# Patient Record
Sex: Male | Born: 1950 | Race: White | Hispanic: No | Marital: Married | Smoking: Never smoker
Health system: Southern US, Community
[De-identification: ages and names within clinical notes are randomized; demographics above are authoritative.]

## PROBLEM LIST (undated history)

## (undated) DIAGNOSIS — F32A Depression, unspecified: Secondary | ICD-10-CM

## (undated) DIAGNOSIS — E785 Hyperlipidemia, unspecified: Secondary | ICD-10-CM

## (undated) DIAGNOSIS — F329 Major depressive disorder, single episode, unspecified: Secondary | ICD-10-CM

## (undated) DIAGNOSIS — N529 Male erectile dysfunction, unspecified: Secondary | ICD-10-CM

## (undated) HISTORY — DX: Male erectile dysfunction, unspecified: N52.9

## (undated) HISTORY — DX: Major depressive disorder, single episode, unspecified: F32.9

## (undated) HISTORY — DX: Hyperlipidemia, unspecified: E78.5

## (undated) HISTORY — DX: Depression, unspecified: F32.A

---

## 2010-10-10 ENCOUNTER — Encounter
Admission: RE | Admit: 2010-10-10 | Discharge: 2010-11-29 | Payer: Self-pay | Source: Home / Self Care | Attending: Orthopaedic Surgery | Admitting: Orthopaedic Surgery

## 2014-06-23 ENCOUNTER — Encounter: Payer: Self-pay | Admitting: Cardiology

## 2014-07-17 ENCOUNTER — Encounter: Payer: Self-pay | Admitting: *Deleted

## 2014-08-12 ENCOUNTER — Ambulatory Visit: Payer: Self-pay | Admitting: Cardiology

## 2014-08-20 ENCOUNTER — Encounter: Payer: Self-pay | Admitting: Cardiology

## 2014-08-20 ENCOUNTER — Ambulatory Visit (INDEPENDENT_AMBULATORY_CARE_PROVIDER_SITE_OTHER): Payer: BC Managed Care – PPO | Admitting: Cardiology

## 2014-08-20 VITALS — BP 130/80 | HR 92 | Ht 70.5 in | Wt 236.0 lb

## 2014-08-20 DIAGNOSIS — N528 Other male erectile dysfunction: Secondary | ICD-10-CM | POA: Insufficient documentation

## 2014-08-20 DIAGNOSIS — I10 Essential (primary) hypertension: Secondary | ICD-10-CM

## 2014-08-20 DIAGNOSIS — I471 Supraventricular tachycardia, unspecified: Secondary | ICD-10-CM

## 2014-08-20 DIAGNOSIS — E669 Obesity, unspecified: Secondary | ICD-10-CM

## 2014-08-20 DIAGNOSIS — N529 Male erectile dysfunction, unspecified: Secondary | ICD-10-CM

## 2014-08-20 NOTE — Patient Instructions (Signed)
Your physician recommends that you continue on your current medications as directed. Please refer to the Current Medication list given to you today.  Your physician wants you to follow-up in: ONE YEAR FOLLOW-UP WITH DR SKAINS You will receive a reminder letter in the mail two months in advance. If you don't receive a letter, please call our office to schedule the follow-up appointment.  

## 2014-08-20 NOTE — Progress Notes (Signed)
[address] [place], [place]  [address] Phone: [phone] Fax:  [phone]  Date:  08/20/2014   ID:  Steven Leblanc, DOB 1950-12-11, MRN [id number]  PCP:  Darnelle Bos, MD   History of Present Illness: Steven Leblanc is a 63 y.o. male followup today for tachycardia. Echocardiogram revealed normal ejection fraction. Holter monitor revealed an average heart rate of 108 which appeared to be sinus tachycardia. Diltiazem long acting at 360 mg was initiated. His pulse decreased appropriately. He also has diabetes as well as hyperlipidemia and is currently compliant with his medicines. I have also initiated metoprolol 50 mg once a day.  He is doing well. 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 No side effects of medications.  No change in symptoms. There was some concern by pharmacist about filling Levitra. I discussed with our pharmacist, Steven Leblanc, there should be no interaction with his medications.    Wt Readings from Last 3 Encounters:  08/20/14 236 lb (107.049 kg)     Past Medical History  Diagnosis Date  . ED (erectile dysfunction)   . Hyperlipidemia   . Depression     No past surgical history on file.  Current Outpatient Prescriptions  Medication Sig Dispense Refill  . aspirin 325 MG tablet Take 325 mg by mouth daily.      Marland Kitchen atorvastatin (LIPITOR) 10 MG tablet Take 10 mg by mouth daily.      Marland Kitchen diltiazem (TIAZAC) 360 MG 24 hr capsule Take 360 mg by mouth daily.      Marland Kitchen glimepiride (AMARYL) 2 MG tablet Take 2 mg by mouth daily with breakfast.      . metoprolol succinate (TOPROL-XL) 50 MG 24 hr tablet Take 50 mg by mouth daily. Take with or immediately following a meal.      . NAFTIN 1 % GEL       . vardenafil (LEVITRA) 20 MG tablet Take 20 mg by mouth daily as needed for erectile dysfunction.      Marland Kitchen venlafaxine (EFFEXOR) 75 MG tablet Take 75 mg by mouth 2 (two) times daily.       No current facility-administered medications for this  visit.    Allergies:   No Known Allergies  Social History:  The patient  reports that he has never smoked. He does not have any smokeless tobacco history on file. He reports that he does not drink alcohol or use illicit drugs.   No family history on file.  ROS:  Please see the history of present illness.   Denies any syncope, bleeding, orthopnea, PND   All other systems reviewed and negative.   PHYSICAL EXAM: VS:  BP 130/80  Pulse 92  Ht 5' 10.5" (1.791 m)  Wt 236 lb (107.049 kg)  BMI 33.37 kg/m2 Well nourished, well developed, in no acute distress HEENT: normal, [place]/AT, EOMI Neck: no JVD, normal carotid upstroke, no bruit Cardiac:  normal S1, S2; RRR; no murmur Lungs:  clear to auscultation bilaterally, no wheezing, rhonchi or rales Abd: soft, nontender, no hepatomegaly, no bruitsObese Ext: no edema, 2+ distal pulses Skin: warm and dry GU: deferred Neuro: no focal abnormalities noted, AAO x 3  EKG:  08/20/14-sinus rhythm, 92 with no other abnormalities     ASSESSMENT AND PLAN:  1. Tachycardia-doing well, continue with her medications. Her symptoms. 2. Erectile dysfunction-he is fine to take his Levitra. There should be no interactions. I have given a prescription stating  that his pharmacist may prescribe. Dr. Earl Gala and given and this in the past. 3. Hypertension-currently well controlled. 4. Obesity-continue to limit carbohydrates. Try to lose weight. Exercise.  Signed, Donato Schultz, MD [facility]  08/20/2014 9:54 AM

## 2015-08-31 ENCOUNTER — Encounter: Payer: Self-pay | Admitting: Cardiology

## 2015-08-31 ENCOUNTER — Ambulatory Visit (INDEPENDENT_AMBULATORY_CARE_PROVIDER_SITE_OTHER): Payer: BLUE CROSS/BLUE SHIELD | Admitting: Cardiology

## 2015-08-31 VITALS — BP 142/88 | HR 96 | Ht 70.5 in | Wt 234.8 lb

## 2015-08-31 DIAGNOSIS — E669 Obesity, unspecified: Secondary | ICD-10-CM

## 2015-08-31 DIAGNOSIS — I471 Supraventricular tachycardia: Secondary | ICD-10-CM

## 2015-08-31 DIAGNOSIS — I10 Essential (primary) hypertension: Secondary | ICD-10-CM | POA: Diagnosis not present

## 2015-08-31 NOTE — Patient Instructions (Signed)
Medication Instructions:  Your physician recommends that you continue on your current medications as directed. Please refer to the Current Medication list given to you today.  Labwork: NONE  Testing/Procedures: NONE  Follow-Up: Your physician wants you to follow-up in: 1 year with Dr. Skains. You will receive a reminder letter in the mail two months in advance. If you don't receive a letter, please call our office to schedule the follow-up appointment.   Any Other Special Instructions Will Be Listed Below (If Applicable).   

## 2015-08-31 NOTE — Progress Notes (Signed)
[address] [place], [place]  [address] Phone: [phone] Fax:  [phone]  Date:  08/31/2015   ID:  KILEY TORRENCE, DOB 1951/05/29, MRN [id number]  PCP:  Lillia Mountain, MD   History of Present Illness: Steven Leblanc is a 64 y.o. male followup today for tachycardia. Echocardiogram revealed normal ejection fraction. Holter monitor revealed an average heart rate of 108 which appeared to be sinus tachycardia. Diltiazem long acting at 360 mg was initiated. His pulse decreased appropriately. He also has diabetes as well as hyperlipidemia and is currently compliant with his medicines. I have also initiated metoprolol 50 mg once a day.  He is doing well. 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 No side effects of medications.  No change in symptoms.  No chest pain, no syncope, no bleeding, no orthopnea, no PND. Waiting to see Dr. Valentina Lucks. New PCP.   Wt Readings from Last 3 Encounters:  08/31/15 234 lb 12.8 oz (106.505 kg)  08/20/14 236 lb (107.049 kg)     Past Medical History  Diagnosis Date  . ED (erectile dysfunction)   . Hyperlipidemia   . Depression     No past surgical history on file.  Current Outpatient Prescriptions  Medication Sig Dispense Refill  . aspirin 325 MG tablet Take 325 mg by mouth daily.    Marland Kitchen atorvastatin (LIPITOR) 10 MG tablet Take 10 mg by mouth daily.    Marland Kitchen diltiazem (TIAZAC) 360 MG 24 hr capsule Take 360 mg by mouth daily.    Marland Kitchen glimepiride (AMARYL) 2 MG tablet Take 2 mg by mouth daily with breakfast.    . metoprolol succinate (TOPROL-XL) 50 MG 24 hr tablet Take 50 mg by mouth daily. Take with or immediately following a meal.    . NAFTIN 1 % GEL     . vardenafil (LEVITRA) 20 MG tablet Take 20 mg by mouth daily as needed for erectile dysfunction.    Marland Kitchen venlafaxine (EFFEXOR) 75 MG tablet Take 75 mg by mouth 2 (two) times daily.    . metformin (FORTAMET) 1000 MG (OSM) 24 hr tablet TAKE 2 TABLETS BY MOUTH EVERY DAY WITH A MEAL   3   No current facility-administered medications for this visit.    Allergies:   No Known Allergies  Social History:  The patient  reports that he has never smoked. He does not have any smokeless tobacco history on file. He reports that he does not drink alcohol or use illicit drugs.   Family History  Problem Relation Age of Onset  . Heart disease Father     ROS:  Please see the history of present illness.   Denies any syncope, bleeding, orthopnea, PND   All other systems reviewed and negative.   PHYSICAL EXAM: VS:  BP 142/88 mmHg  Pulse 96  Ht 5' 10.5" (1.791 m)  Wt 234 lb 12.8 oz (106.505 kg)  BMI 33.20 kg/m2 Well nourished, well developed, in no acute distress HEENT: normal, [place]/AT, EOMI Neck: no JVD, normal carotid upstroke, no bruit Cardiac:  normal S1, S2; RRR; no murmur Lungs:  clear to auscultation bilaterally, no wheezing, rhonchi or rales Abd: soft, nontender, no hepatomegaly, no bruitsObese Ext: no edema, 2+ distal pulses Skin: warm and dry GU: deferred Neuro: no focal abnormalities noted, AAO x 3  EKG:  08/31/15 - sinus rhythm, 95, no other abnormalities. 08/20/14-sinus rhythm, 92 with no other abnormalities     ASSESSMENT AND PLAN:  1. Tachycardia-doing well, continue with medications. Diltiazem and metoprolol combination. No symptoms. 2. Erectile dysfunction-he is fine to take his Levitra. Discussed at prior visit. There should be no interactions.  3. Hypertension-currently borderline elevated. Continue to monitor this. He is going to be seeing Dr. Valentina Lucks soon. Used to see Dr. Earl Gala. 4. Obesity-continue to limit carbohydrates. Try to lose weight. Exercise. This will help with blood pressure.  Signed, Donato Schultz, MD [facility]  08/31/2015 9:07 AM

## 2016-04-10 DIAGNOSIS — G4733 Obstructive sleep apnea (adult) (pediatric): Secondary | ICD-10-CM | POA: Diagnosis not present

## 2016-06-15 DIAGNOSIS — E119 Type 2 diabetes mellitus without complications: Secondary | ICD-10-CM | POA: Diagnosis not present

## 2016-06-15 DIAGNOSIS — Z125 Encounter for screening for malignant neoplasm of prostate: Secondary | ICD-10-CM | POA: Diagnosis not present

## 2016-06-15 DIAGNOSIS — Z0000 Encounter for general adult medical examination without abnormal findings: Secondary | ICD-10-CM | POA: Diagnosis not present

## 2016-06-15 DIAGNOSIS — Z23 Encounter for immunization: Secondary | ICD-10-CM | POA: Diagnosis not present

## 2016-06-15 DIAGNOSIS — E7800 Pure hypercholesterolemia, unspecified: Secondary | ICD-10-CM | POA: Diagnosis not present

## 2016-06-15 DIAGNOSIS — G4733 Obstructive sleep apnea (adult) (pediatric): Secondary | ICD-10-CM | POA: Diagnosis not present

## 2016-07-11 DIAGNOSIS — G4733 Obstructive sleep apnea (adult) (pediatric): Secondary | ICD-10-CM | POA: Diagnosis not present

## 2016-10-18 DIAGNOSIS — F3340 Major depressive disorder, recurrent, in remission, unspecified: Secondary | ICD-10-CM | POA: Diagnosis not present

## 2016-10-18 DIAGNOSIS — Z23 Encounter for immunization: Secondary | ICD-10-CM | POA: Diagnosis not present

## 2016-10-18 DIAGNOSIS — E119 Type 2 diabetes mellitus without complications: Secondary | ICD-10-CM | POA: Diagnosis not present

## 2016-12-18 DIAGNOSIS — E119 Type 2 diabetes mellitus without complications: Secondary | ICD-10-CM | POA: Diagnosis not present

## 2016-12-18 DIAGNOSIS — H5213 Myopia, bilateral: Secondary | ICD-10-CM | POA: Diagnosis not present

## 2016-12-18 DIAGNOSIS — H524 Presbyopia: Secondary | ICD-10-CM | POA: Diagnosis not present

## 2016-12-18 DIAGNOSIS — H2513 Age-related nuclear cataract, bilateral: Secondary | ICD-10-CM | POA: Diagnosis not present

## 2017-02-22 DIAGNOSIS — E119 Type 2 diabetes mellitus without complications: Secondary | ICD-10-CM | POA: Diagnosis not present

## 2017-06-17 DIAGNOSIS — G4733 Obstructive sleep apnea (adult) (pediatric): Secondary | ICD-10-CM | POA: Diagnosis not present

## 2017-06-17 DIAGNOSIS — Z0000 Encounter for general adult medical examination without abnormal findings: Secondary | ICD-10-CM | POA: Diagnosis not present

## 2017-06-17 DIAGNOSIS — E119 Type 2 diabetes mellitus without complications: Secondary | ICD-10-CM | POA: Diagnosis not present

## 2017-06-17 DIAGNOSIS — F3340 Major depressive disorder, recurrent, in remission, unspecified: Secondary | ICD-10-CM | POA: Diagnosis not present

## 2017-06-17 DIAGNOSIS — E7800 Pure hypercholesterolemia, unspecified: Secondary | ICD-10-CM | POA: Diagnosis not present

## 2017-06-17 DIAGNOSIS — E1165 Type 2 diabetes mellitus with hyperglycemia: Secondary | ICD-10-CM | POA: Diagnosis not present

## 2017-06-17 DIAGNOSIS — Z23 Encounter for immunization: Secondary | ICD-10-CM | POA: Diagnosis not present

## 2017-10-21 DIAGNOSIS — G4733 Obstructive sleep apnea (adult) (pediatric): Secondary | ICD-10-CM | POA: Diagnosis not present

## 2017-10-21 DIAGNOSIS — E119 Type 2 diabetes mellitus without complications: Secondary | ICD-10-CM | POA: Diagnosis not present

## 2017-10-21 DIAGNOSIS — F325 Major depressive disorder, single episode, in full remission: Secondary | ICD-10-CM | POA: Diagnosis not present

## 2017-10-21 DIAGNOSIS — R000 Tachycardia, unspecified: Secondary | ICD-10-CM | POA: Diagnosis not present

## 2017-10-21 DIAGNOSIS — Z23 Encounter for immunization: Secondary | ICD-10-CM | POA: Diagnosis not present

## 2018-03-04 DIAGNOSIS — E119 Type 2 diabetes mellitus without complications: Secondary | ICD-10-CM | POA: Diagnosis not present

## 2018-03-04 DIAGNOSIS — F3340 Major depressive disorder, recurrent, in remission, unspecified: Secondary | ICD-10-CM | POA: Diagnosis not present

## 2018-03-04 DIAGNOSIS — G4733 Obstructive sleep apnea (adult) (pediatric): Secondary | ICD-10-CM | POA: Diagnosis not present

## 2018-03-24 DIAGNOSIS — E119 Type 2 diabetes mellitus without complications: Secondary | ICD-10-CM | POA: Diagnosis not present

## 2018-03-24 DIAGNOSIS — J0110 Acute frontal sinusitis, unspecified: Secondary | ICD-10-CM | POA: Diagnosis not present

## 2018-05-12 DIAGNOSIS — E162 Hypoglycemia, unspecified: Secondary | ICD-10-CM | POA: Diagnosis not present

## 2018-07-01 ENCOUNTER — Other Ambulatory Visit: Payer: Self-pay | Admitting: Internal Medicine

## 2018-07-01 ENCOUNTER — Ambulatory Visit
Admission: RE | Admit: 2018-07-01 | Discharge: 2018-07-01 | Disposition: A | Discharge status: Home / Self Care | Payer: BLUE CROSS/BLUE SHIELD | Source: Ambulatory Visit | Attending: Internal Medicine | Admitting: Internal Medicine

## 2018-07-01 DIAGNOSIS — M25511 Pain in right shoulder: Secondary | ICD-10-CM

## 2018-07-01 DIAGNOSIS — E1169 Type 2 diabetes mellitus with other specified complication: Secondary | ICD-10-CM | POA: Diagnosis not present

## 2018-07-01 DIAGNOSIS — R000 Tachycardia, unspecified: Secondary | ICD-10-CM | POA: Diagnosis not present

## 2018-07-01 DIAGNOSIS — E7800 Pure hypercholesterolemia, unspecified: Secondary | ICD-10-CM | POA: Diagnosis not present

## 2018-07-01 DIAGNOSIS — G4733 Obstructive sleep apnea (adult) (pediatric): Secondary | ICD-10-CM | POA: Diagnosis not present

## 2018-07-01 DIAGNOSIS — Z1211 Encounter for screening for malignant neoplasm of colon: Secondary | ICD-10-CM | POA: Diagnosis not present

## 2018-07-01 DIAGNOSIS — Z0000 Encounter for general adult medical examination without abnormal findings: Secondary | ICD-10-CM | POA: Diagnosis not present

## 2018-07-01 DIAGNOSIS — M19011 Primary osteoarthritis, right shoulder: Secondary | ICD-10-CM | POA: Diagnosis not present

## 2018-07-10 DIAGNOSIS — G4733 Obstructive sleep apnea (adult) (pediatric): Secondary | ICD-10-CM | POA: Diagnosis not present

## 2018-07-14 DIAGNOSIS — E119 Type 2 diabetes mellitus without complications: Secondary | ICD-10-CM | POA: Diagnosis not present

## 2018-10-24 DIAGNOSIS — G4733 Obstructive sleep apnea (adult) (pediatric): Secondary | ICD-10-CM | POA: Diagnosis not present

## 2018-10-24 DIAGNOSIS — I10 Essential (primary) hypertension: Secondary | ICD-10-CM | POA: Diagnosis not present

## 2018-10-24 DIAGNOSIS — E1169 Type 2 diabetes mellitus with other specified complication: Secondary | ICD-10-CM | POA: Diagnosis not present

## 2018-10-24 DIAGNOSIS — E7800 Pure hypercholesterolemia, unspecified: Secondary | ICD-10-CM | POA: Diagnosis not present

## 2019-01-31 DIAGNOSIS — Z23 Encounter for immunization: Secondary | ICD-10-CM | POA: Diagnosis not present

## 2019-03-02 DIAGNOSIS — I10 Essential (primary) hypertension: Secondary | ICD-10-CM | POA: Diagnosis not present

## 2019-03-02 DIAGNOSIS — E1169 Type 2 diabetes mellitus with other specified complication: Secondary | ICD-10-CM | POA: Diagnosis not present

## 2019-03-03 DIAGNOSIS — E1169 Type 2 diabetes mellitus with other specified complication: Secondary | ICD-10-CM | POA: Diagnosis not present

## 2019-04-17 DIAGNOSIS — G4733 Obstructive sleep apnea (adult) (pediatric): Secondary | ICD-10-CM | POA: Diagnosis not present

## 2019-05-18 DIAGNOSIS — G4733 Obstructive sleep apnea (adult) (pediatric): Secondary | ICD-10-CM | POA: Diagnosis not present

## 2019-06-17 DIAGNOSIS — G4733 Obstructive sleep apnea (adult) (pediatric): Secondary | ICD-10-CM | POA: Diagnosis not present

## 2019-07-18 DIAGNOSIS — G4733 Obstructive sleep apnea (adult) (pediatric): Secondary | ICD-10-CM | POA: Diagnosis not present

## 2019-07-24 DIAGNOSIS — F3340 Major depressive disorder, recurrent, in remission, unspecified: Secondary | ICD-10-CM | POA: Diagnosis not present

## 2019-07-24 DIAGNOSIS — G4733 Obstructive sleep apnea (adult) (pediatric): Secondary | ICD-10-CM | POA: Diagnosis not present

## 2019-07-24 DIAGNOSIS — E1169 Type 2 diabetes mellitus with other specified complication: Secondary | ICD-10-CM | POA: Diagnosis not present

## 2019-07-24 DIAGNOSIS — E7800 Pure hypercholesterolemia, unspecified: Secondary | ICD-10-CM | POA: Diagnosis not present

## 2019-08-18 DIAGNOSIS — G4733 Obstructive sleep apnea (adult) (pediatric): Secondary | ICD-10-CM | POA: Diagnosis not present

## 2019-08-23 IMAGING — CR DG SHOULDER 2+V*R*
3 series · 3 of 3 positions shown · non-contrast
Comparison: None.

CLINICAL DATA: 67-year-old male chronic right shoulder pain.
Initial encounter.

EXAM:
RIGHT SHOULDER - 2+ VIEW

[w shoulder ap internal righ]
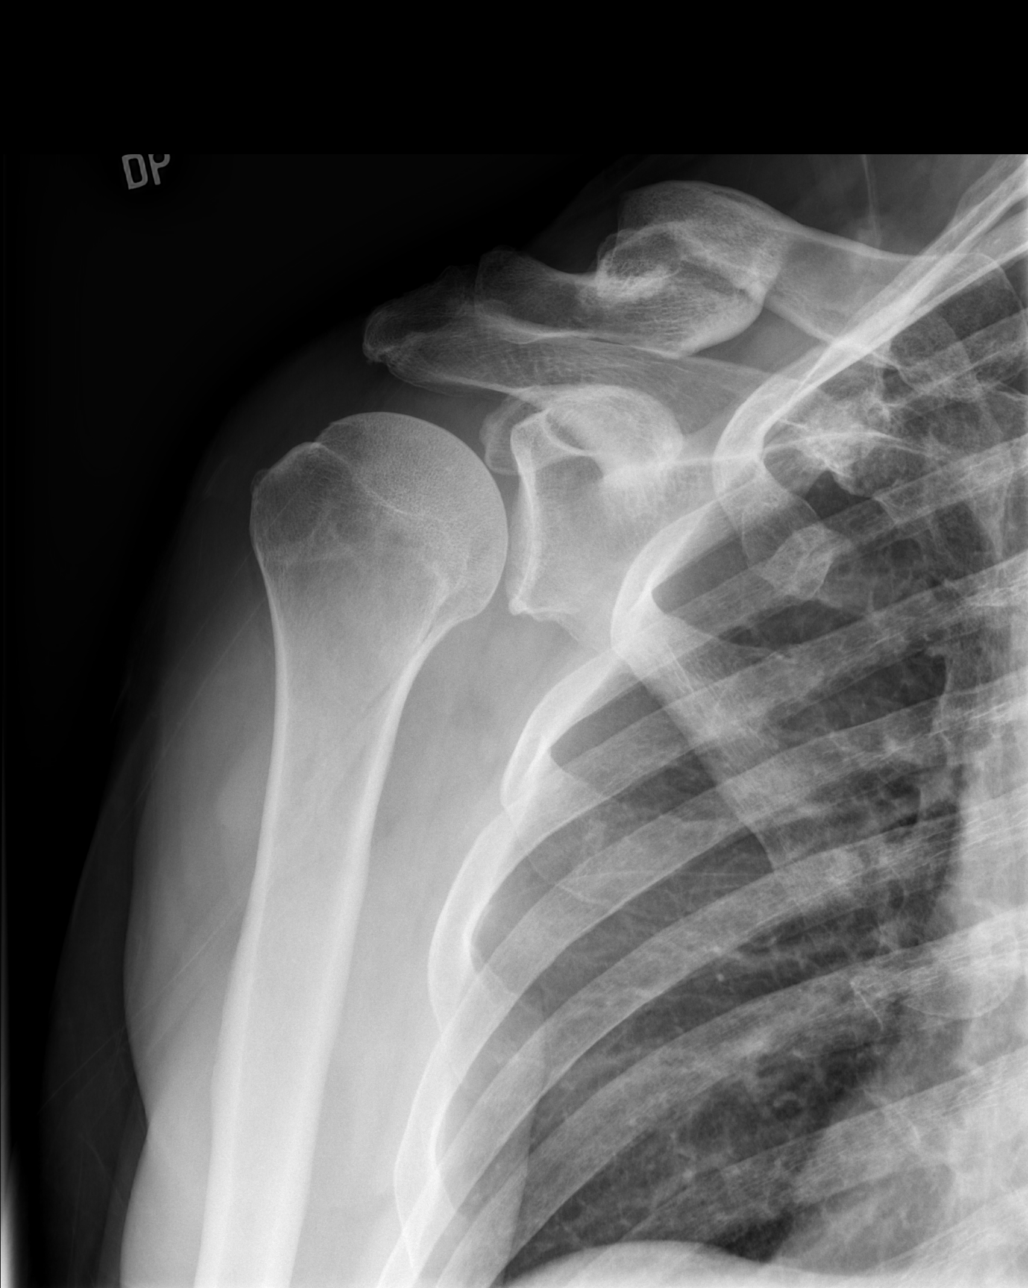

[w shoulder y view right]
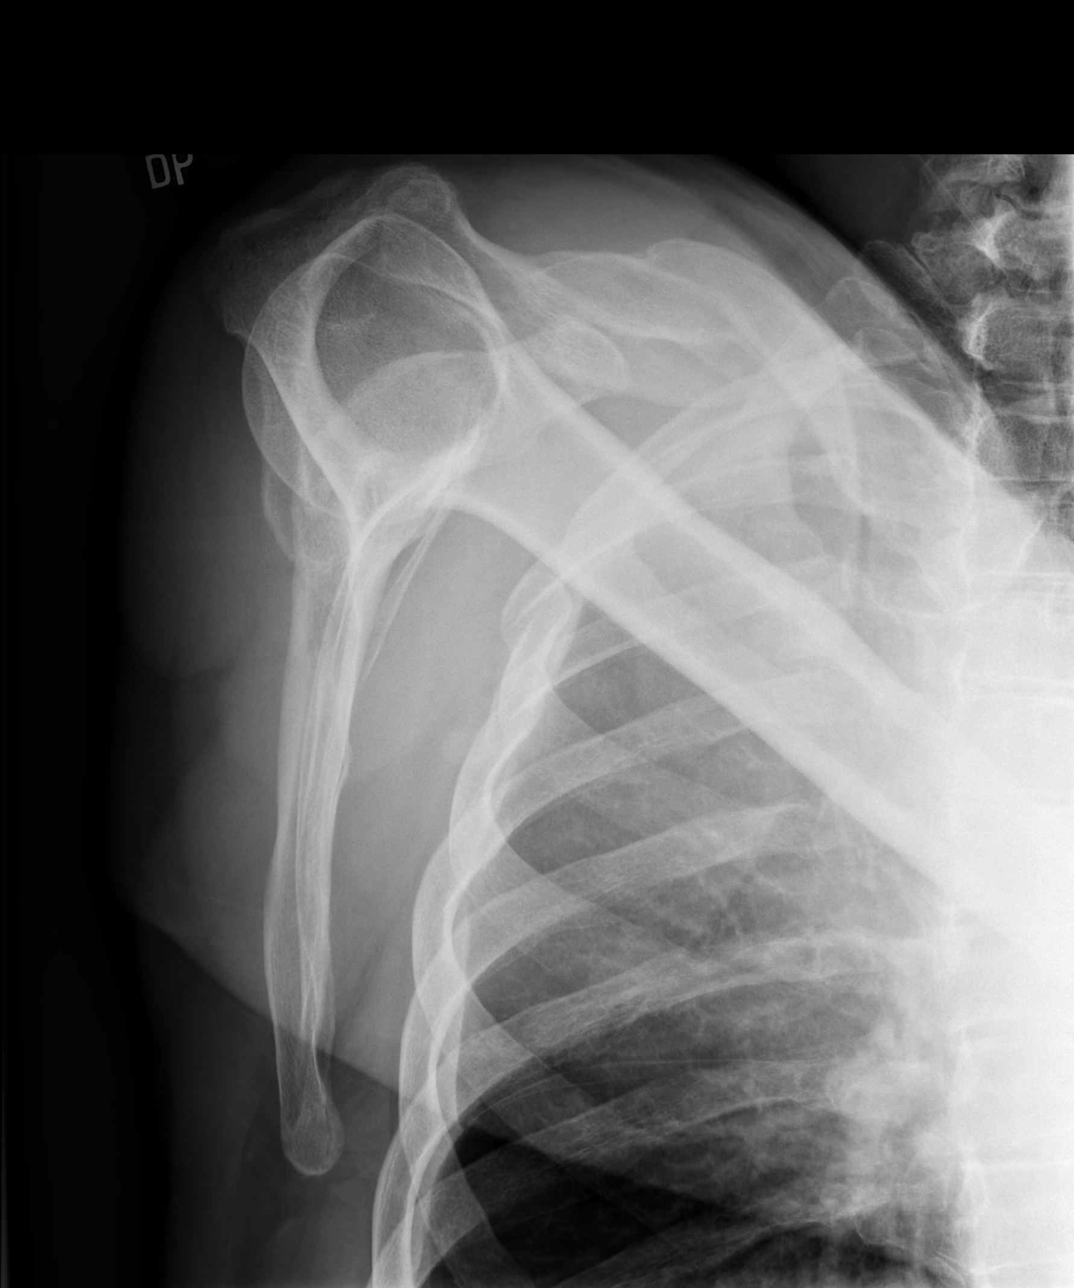

[w shoulder axillary right *]
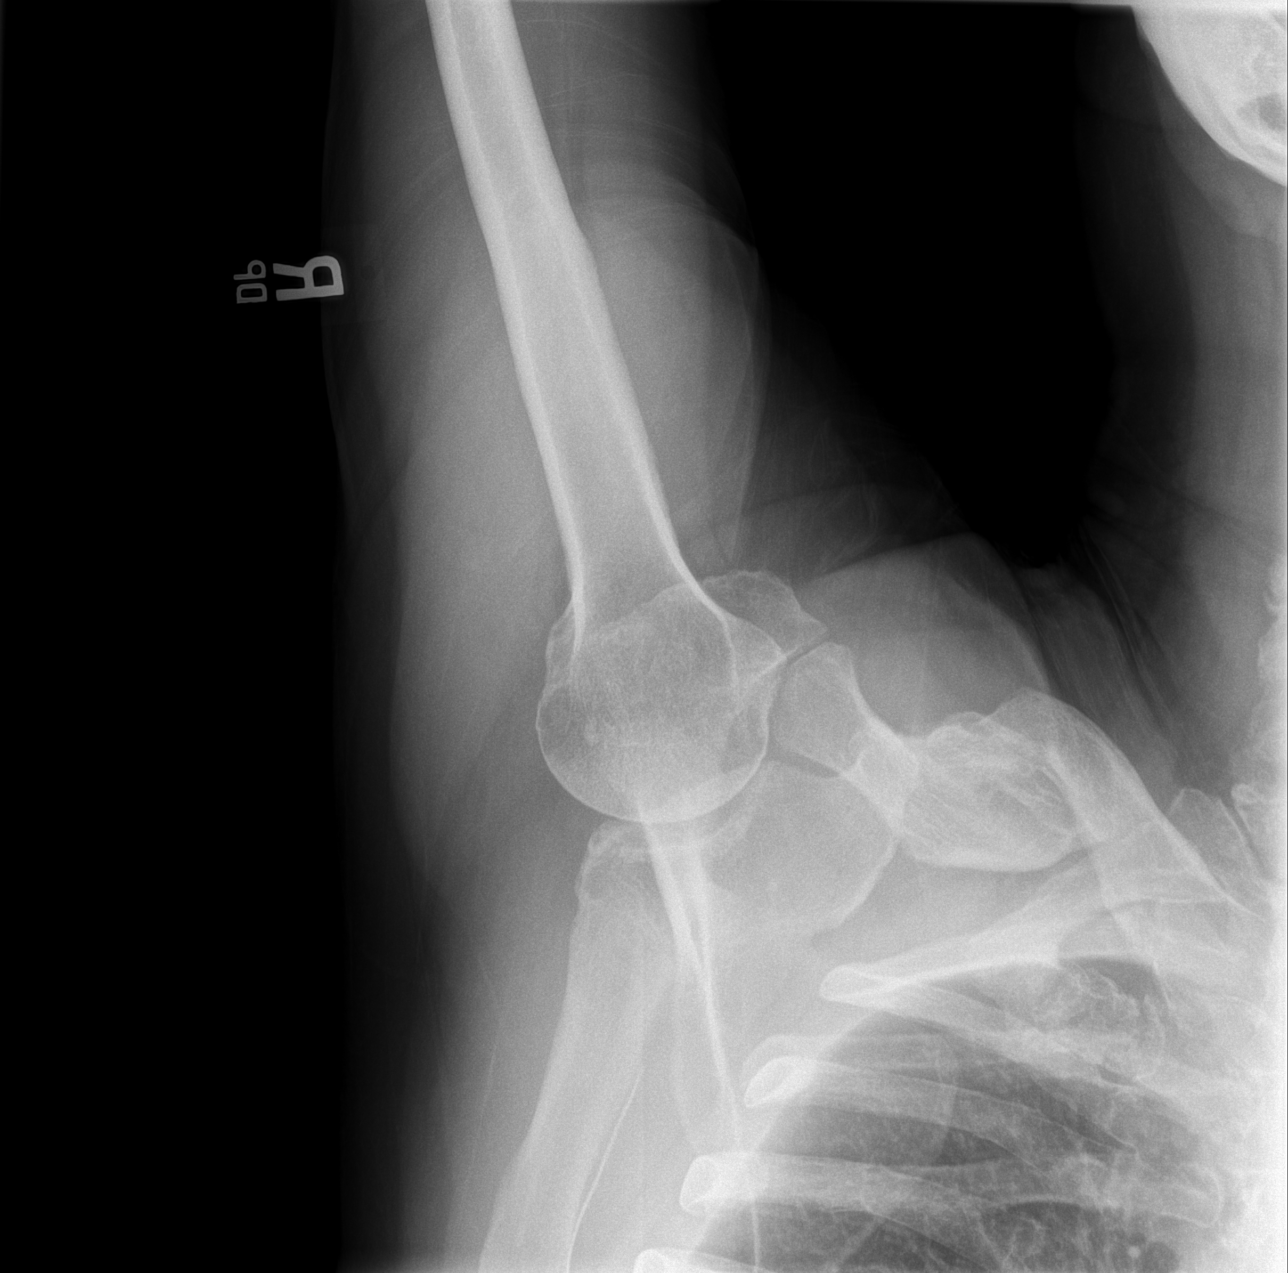

[3 of 3 positions shown; findings below may reference images not displayed]

FINDINGS: Remote right clavicle fracture.

Mild acromioclavicular joint degenerative changes.

No acute fracture or dislocation.

No abnormal soft tissue calcifications.

Visualized lungs clear.
IMPRESSION: Remote right clavicle fracture.

Mild right acromioclavicular joint degenerative changes.

## 2019-09-17 DIAGNOSIS — G4733 Obstructive sleep apnea (adult) (pediatric): Secondary | ICD-10-CM | POA: Diagnosis not present

## 2019-10-18 DIAGNOSIS — G4733 Obstructive sleep apnea (adult) (pediatric): Secondary | ICD-10-CM | POA: Diagnosis not present

## 2019-11-09 DIAGNOSIS — G4733 Obstructive sleep apnea (adult) (pediatric): Secondary | ICD-10-CM | POA: Diagnosis not present

## 2019-11-09 DIAGNOSIS — E1169 Type 2 diabetes mellitus with other specified complication: Secondary | ICD-10-CM | POA: Diagnosis not present

## 2019-11-11 DIAGNOSIS — E1169 Type 2 diabetes mellitus with other specified complication: Secondary | ICD-10-CM | POA: Diagnosis not present

## 2019-11-17 DIAGNOSIS — G4733 Obstructive sleep apnea (adult) (pediatric): Secondary | ICD-10-CM | POA: Diagnosis not present

## 2020-03-15 DIAGNOSIS — E1169 Type 2 diabetes mellitus with other specified complication: Secondary | ICD-10-CM | POA: Diagnosis not present

## 2020-03-15 DIAGNOSIS — I10 Essential (primary) hypertension: Secondary | ICD-10-CM | POA: Diagnosis not present

## 2020-03-15 DIAGNOSIS — B351 Tinea unguium: Secondary | ICD-10-CM | POA: Diagnosis not present

## 2020-03-15 DIAGNOSIS — G4733 Obstructive sleep apnea (adult) (pediatric): Secondary | ICD-10-CM | POA: Diagnosis not present

## 2020-03-17 DIAGNOSIS — G4733 Obstructive sleep apnea (adult) (pediatric): Secondary | ICD-10-CM | POA: Diagnosis not present

## 2020-04-16 DIAGNOSIS — G4733 Obstructive sleep apnea (adult) (pediatric): Secondary | ICD-10-CM | POA: Diagnosis not present

## 2020-05-17 DIAGNOSIS — G4733 Obstructive sleep apnea (adult) (pediatric): Secondary | ICD-10-CM | POA: Diagnosis not present

## 2020-06-16 DIAGNOSIS — G4733 Obstructive sleep apnea (adult) (pediatric): Secondary | ICD-10-CM | POA: Diagnosis not present

## 2020-07-26 DIAGNOSIS — E7800 Pure hypercholesterolemia, unspecified: Secondary | ICD-10-CM | POA: Diagnosis not present

## 2020-07-26 DIAGNOSIS — I10 Essential (primary) hypertension: Secondary | ICD-10-CM | POA: Diagnosis not present

## 2020-07-26 DIAGNOSIS — G4733 Obstructive sleep apnea (adult) (pediatric): Secondary | ICD-10-CM | POA: Diagnosis not present

## 2020-07-26 DIAGNOSIS — Z0000 Encounter for general adult medical examination without abnormal findings: Secondary | ICD-10-CM | POA: Diagnosis not present

## 2020-07-26 DIAGNOSIS — E1169 Type 2 diabetes mellitus with other specified complication: Secondary | ICD-10-CM | POA: Diagnosis not present

## 2020-10-19 DIAGNOSIS — H5213 Myopia, bilateral: Secondary | ICD-10-CM | POA: Diagnosis not present

## 2020-10-19 DIAGNOSIS — H524 Presbyopia: Secondary | ICD-10-CM | POA: Diagnosis not present

## 2020-10-19 DIAGNOSIS — H2513 Age-related nuclear cataract, bilateral: Secondary | ICD-10-CM | POA: Diagnosis not present

## 2020-10-19 DIAGNOSIS — H52223 Regular astigmatism, bilateral: Secondary | ICD-10-CM | POA: Diagnosis not present

## 2020-10-19 DIAGNOSIS — E119 Type 2 diabetes mellitus without complications: Secondary | ICD-10-CM | POA: Diagnosis not present

## 2021-11-10 DIAGNOSIS — E1169 Type 2 diabetes mellitus with other specified complication: Secondary | ICD-10-CM | POA: Diagnosis not present

## 2021-11-10 DIAGNOSIS — I10 Essential (primary) hypertension: Secondary | ICD-10-CM | POA: Diagnosis not present

## 2021-11-10 DIAGNOSIS — Z7984 Long term (current) use of oral hypoglycemic drugs: Secondary | ICD-10-CM | POA: Diagnosis not present

## 2021-11-10 DIAGNOSIS — R21 Rash and other nonspecific skin eruption: Secondary | ICD-10-CM | POA: Diagnosis not present

## 2021-11-27 DIAGNOSIS — I10 Essential (primary) hypertension: Secondary | ICD-10-CM | POA: Diagnosis not present

## 2021-11-27 DIAGNOSIS — J069 Acute upper respiratory infection, unspecified: Secondary | ICD-10-CM | POA: Diagnosis not present

## 2022-03-13 DIAGNOSIS — G4733 Obstructive sleep apnea (adult) (pediatric): Secondary | ICD-10-CM | POA: Diagnosis not present

## 2022-03-13 DIAGNOSIS — I10 Essential (primary) hypertension: Secondary | ICD-10-CM | POA: Diagnosis not present

## 2022-03-13 DIAGNOSIS — E7800 Pure hypercholesterolemia, unspecified: Secondary | ICD-10-CM | POA: Diagnosis not present

## 2022-03-13 DIAGNOSIS — E1169 Type 2 diabetes mellitus with other specified complication: Secondary | ICD-10-CM | POA: Diagnosis not present

## 2022-03-13 DIAGNOSIS — Z7984 Long term (current) use of oral hypoglycemic drugs: Secondary | ICD-10-CM | POA: Diagnosis not present

## 2022-07-20 DIAGNOSIS — G4733 Obstructive sleep apnea (adult) (pediatric): Secondary | ICD-10-CM | POA: Diagnosis not present

## 2022-07-20 DIAGNOSIS — F3340 Major depressive disorder, recurrent, in remission, unspecified: Secondary | ICD-10-CM | POA: Diagnosis not present

## 2022-07-20 DIAGNOSIS — E7800 Pure hypercholesterolemia, unspecified: Secondary | ICD-10-CM | POA: Diagnosis not present

## 2022-07-20 DIAGNOSIS — E1169 Type 2 diabetes mellitus with other specified complication: Secondary | ICD-10-CM | POA: Diagnosis not present

## 2022-07-20 DIAGNOSIS — Z0000 Encounter for general adult medical examination without abnormal findings: Secondary | ICD-10-CM | POA: Diagnosis not present

## 2022-07-20 DIAGNOSIS — I10 Essential (primary) hypertension: Secondary | ICD-10-CM | POA: Diagnosis not present

## 2022-11-20 DIAGNOSIS — H6691 Otitis media, unspecified, right ear: Secondary | ICD-10-CM | POA: Diagnosis not present

## 2023-07-24 DIAGNOSIS — Z0000 Encounter for general adult medical examination without abnormal findings: Secondary | ICD-10-CM | POA: Diagnosis not present

## 2023-07-24 DIAGNOSIS — G8929 Other chronic pain: Secondary | ICD-10-CM | POA: Diagnosis not present

## 2023-07-24 DIAGNOSIS — E119 Type 2 diabetes mellitus without complications: Secondary | ICD-10-CM | POA: Diagnosis not present

## 2023-07-24 DIAGNOSIS — Z79899 Other long term (current) drug therapy: Secondary | ICD-10-CM | POA: Diagnosis not present

## 2023-07-24 DIAGNOSIS — Z23 Encounter for immunization: Secondary | ICD-10-CM | POA: Diagnosis not present

## 2023-07-24 DIAGNOSIS — F3340 Major depressive disorder, recurrent, in remission, unspecified: Secondary | ICD-10-CM | POA: Diagnosis not present

## 2023-07-24 DIAGNOSIS — E1169 Type 2 diabetes mellitus with other specified complication: Secondary | ICD-10-CM | POA: Diagnosis not present

## 2023-07-24 DIAGNOSIS — G4733 Obstructive sleep apnea (adult) (pediatric): Secondary | ICD-10-CM | POA: Diagnosis not present

## 2023-07-24 DIAGNOSIS — R21 Rash and other nonspecific skin eruption: Secondary | ICD-10-CM | POA: Diagnosis not present

## 2023-07-24 DIAGNOSIS — I10 Essential (primary) hypertension: Secondary | ICD-10-CM | POA: Diagnosis not present

## 2023-07-24 DIAGNOSIS — E7800 Pure hypercholesterolemia, unspecified: Secondary | ICD-10-CM | POA: Diagnosis not present

## 2023-07-24 DIAGNOSIS — Z1331 Encounter for screening for depression: Secondary | ICD-10-CM | POA: Diagnosis not present

## 2023-07-24 DIAGNOSIS — Z1211 Encounter for screening for malignant neoplasm of colon: Secondary | ICD-10-CM | POA: Diagnosis not present

## 2023-09-26 DIAGNOSIS — G4733 Obstructive sleep apnea (adult) (pediatric): Secondary | ICD-10-CM | POA: Diagnosis not present

## 2023-11-20 DIAGNOSIS — Z9989 Dependence on other enabling machines and devices: Secondary | ICD-10-CM | POA: Diagnosis not present

## 2023-11-20 DIAGNOSIS — E119 Type 2 diabetes mellitus without complications: Secondary | ICD-10-CM | POA: Diagnosis not present

## 2023-11-20 DIAGNOSIS — G4733 Obstructive sleep apnea (adult) (pediatric): Secondary | ICD-10-CM | POA: Diagnosis not present

## 2023-11-20 DIAGNOSIS — F3340 Major depressive disorder, recurrent, in remission, unspecified: Secondary | ICD-10-CM | POA: Diagnosis not present

## 2023-11-20 DIAGNOSIS — E7800 Pure hypercholesterolemia, unspecified: Secondary | ICD-10-CM | POA: Diagnosis not present

## 2023-11-20 DIAGNOSIS — E1169 Type 2 diabetes mellitus with other specified complication: Secondary | ICD-10-CM | POA: Diagnosis not present

## 2023-11-20 DIAGNOSIS — I10 Essential (primary) hypertension: Secondary | ICD-10-CM | POA: Diagnosis not present

## 2024-03-02 DIAGNOSIS — G4733 Obstructive sleep apnea (adult) (pediatric): Secondary | ICD-10-CM | POA: Diagnosis not present

## 2024-03-19 DIAGNOSIS — E1169 Type 2 diabetes mellitus with other specified complication: Secondary | ICD-10-CM | POA: Diagnosis not present

## 2024-03-19 DIAGNOSIS — E7800 Pure hypercholesterolemia, unspecified: Secondary | ICD-10-CM | POA: Diagnosis not present

## 2024-03-19 DIAGNOSIS — I10 Essential (primary) hypertension: Secondary | ICD-10-CM | POA: Diagnosis not present

## 2024-03-19 DIAGNOSIS — E663 Overweight: Secondary | ICD-10-CM | POA: Diagnosis not present

## 2024-03-19 DIAGNOSIS — G4733 Obstructive sleep apnea (adult) (pediatric): Secondary | ICD-10-CM | POA: Diagnosis not present

## 2024-03-19 DIAGNOSIS — Z1211 Encounter for screening for malignant neoplasm of colon: Secondary | ICD-10-CM | POA: Diagnosis not present

## 2024-03-19 DIAGNOSIS — F3340 Major depressive disorder, recurrent, in remission, unspecified: Secondary | ICD-10-CM | POA: Diagnosis not present

## 2024-04-09 DIAGNOSIS — E875 Hyperkalemia: Secondary | ICD-10-CM | POA: Diagnosis not present

## 2024-07-27 DIAGNOSIS — G4733 Obstructive sleep apnea (adult) (pediatric): Secondary | ICD-10-CM | POA: Diagnosis not present

## 2024-07-27 DIAGNOSIS — E559 Vitamin D deficiency, unspecified: Secondary | ICD-10-CM | POA: Diagnosis not present

## 2024-07-27 DIAGNOSIS — E663 Overweight: Secondary | ICD-10-CM | POA: Diagnosis not present

## 2024-07-27 DIAGNOSIS — I10 Essential (primary) hypertension: Secondary | ICD-10-CM | POA: Diagnosis not present

## 2024-07-27 DIAGNOSIS — B351 Tinea unguium: Secondary | ICD-10-CM | POA: Diagnosis not present

## 2024-07-27 DIAGNOSIS — E1169 Type 2 diabetes mellitus with other specified complication: Secondary | ICD-10-CM | POA: Diagnosis not present

## 2024-07-27 DIAGNOSIS — Z0000 Encounter for general adult medical examination without abnormal findings: Secondary | ICD-10-CM | POA: Diagnosis not present

## 2024-07-27 DIAGNOSIS — Z1331 Encounter for screening for depression: Secondary | ICD-10-CM | POA: Diagnosis not present

## 2024-07-27 DIAGNOSIS — E7800 Pure hypercholesterolemia, unspecified: Secondary | ICD-10-CM | POA: Diagnosis not present

## 2024-07-27 DIAGNOSIS — F3340 Major depressive disorder, recurrent, in remission, unspecified: Secondary | ICD-10-CM | POA: Diagnosis not present

## 2024-07-27 DIAGNOSIS — Z79899 Other long term (current) drug therapy: Secondary | ICD-10-CM | POA: Diagnosis not present

## 2024-07-30 DIAGNOSIS — L918 Other hypertrophic disorders of the skin: Secondary | ICD-10-CM | POA: Diagnosis not present

## 2024-07-30 DIAGNOSIS — L814 Other melanin hyperpigmentation: Secondary | ICD-10-CM | POA: Diagnosis not present

## 2024-07-30 DIAGNOSIS — D2261 Melanocytic nevi of right upper limb, including shoulder: Secondary | ICD-10-CM | POA: Diagnosis not present

## 2024-07-30 DIAGNOSIS — B354 Tinea corporis: Secondary | ICD-10-CM | POA: Diagnosis not present

## 2024-07-30 DIAGNOSIS — D225 Melanocytic nevi of trunk: Secondary | ICD-10-CM | POA: Diagnosis not present

## 2024-07-30 DIAGNOSIS — L570 Actinic keratosis: Secondary | ICD-10-CM | POA: Diagnosis not present

## 2024-07-30 DIAGNOSIS — D485 Neoplasm of uncertain behavior of skin: Secondary | ICD-10-CM | POA: Diagnosis not present

## 2024-07-30 DIAGNOSIS — B353 Tinea pedis: Secondary | ICD-10-CM | POA: Diagnosis not present

## 2024-07-30 DIAGNOSIS — L738 Other specified follicular disorders: Secondary | ICD-10-CM | POA: Diagnosis not present

## 2024-07-30 DIAGNOSIS — D2362 Other benign neoplasm of skin of left upper limb, including shoulder: Secondary | ICD-10-CM | POA: Diagnosis not present

## 2024-07-30 DIAGNOSIS — B351 Tinea unguium: Secondary | ICD-10-CM | POA: Diagnosis not present

## 2024-07-30 DIAGNOSIS — L821 Other seborrheic keratosis: Secondary | ICD-10-CM | POA: Diagnosis not present

## 2024-08-28 DIAGNOSIS — B351 Tinea unguium: Secondary | ICD-10-CM | POA: Diagnosis not present
